# Patient Record
Sex: Male | Born: 1975 | Race: Black or African American | Hispanic: No | Marital: Single | Smoking: Current every day smoker
Health system: Southern US, Community
[De-identification: ages and names within clinical notes are randomized; demographics above are authoritative.]

## PROBLEM LIST (undated history)

## (undated) DIAGNOSIS — E119 Type 2 diabetes mellitus without complications: Secondary | ICD-10-CM

## (undated) DIAGNOSIS — R739 Hyperglycemia, unspecified: Secondary | ICD-10-CM

## (undated) DIAGNOSIS — E559 Vitamin D deficiency, unspecified: Secondary | ICD-10-CM

## (undated) HISTORY — DX: Type 2 diabetes mellitus without complications: E11.9

## (undated) HISTORY — PX: HAND SURGERY: SHX662

## (undated) HISTORY — PX: EXTERNAL EAR SURGERY: SHX627

---
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
## 2019-07-29 ENCOUNTER — Emergency Department (HOSPITAL_COMMUNITY)
Admission: EM | Admit: 2019-07-29 | Discharge: 2019-07-30 | Disposition: A | Discharge status: Home / Self Care | Payer: Self-pay | Attending: Emergency Medicine | Admitting: Emergency Medicine

## 2019-07-29 ENCOUNTER — Emergency Department (HOSPITAL_COMMUNITY): Payer: Self-pay

## 2019-07-29 ENCOUNTER — Other Ambulatory Visit: Payer: Self-pay

## 2019-07-29 ENCOUNTER — Encounter (HOSPITAL_COMMUNITY): Payer: Self-pay

## 2019-07-29 DIAGNOSIS — F17210 Nicotine dependence, cigarettes, uncomplicated: Secondary | ICD-10-CM | POA: Insufficient documentation

## 2019-07-29 DIAGNOSIS — H538 Other visual disturbances: Secondary | ICD-10-CM | POA: Insufficient documentation

## 2019-07-29 DIAGNOSIS — R739 Hyperglycemia, unspecified: Secondary | ICD-10-CM | POA: Insufficient documentation

## 2019-07-29 DIAGNOSIS — F1210 Cannabis abuse, uncomplicated: Secondary | ICD-10-CM | POA: Insufficient documentation

## 2019-07-29 LAB — COMPREHENSIVE METABOLIC PANEL
ALT: 41 U/L (ref 0–44)
AST: 30 U/L (ref 15–41)
Albumin: 4.2 g/dL (ref 3.5–5.0)
Alkaline Phosphatase: 68 U/L (ref 38–126)
Anion gap: 15 (ref 5–15)
BUN: 21 mg/dL — ABNORMAL HIGH (ref 6–20)
CO2: 19 mmol/L — ABNORMAL LOW (ref 22–32)
Calcium: 9.4 mg/dL (ref 8.9–10.3)
Chloride: 94 mmol/L — ABNORMAL LOW (ref 98–111)
Creatinine, Ser: 1.32 mg/dL — ABNORMAL HIGH (ref 0.61–1.24)
GFR calc Af Amer: >60 mL/min (ref >60)
GFR calc non Af Amer: >60 mL/min (ref >60)
Glucose, Bld: 751 mg/dL (ref 70–99)
Potassium: 5 mmol/L (ref 3.5–5.1)
Sodium: 128 mmol/L — ABNORMAL LOW (ref 135–145)
Total Bilirubin: 1.4 mg/dL — ABNORMAL HIGH (ref 0.3–1.2)
Total Protein: 7.5 g/dL (ref 6.5–8.1)

## 2019-07-29 LAB — CBC WITH DIFFERENTIAL/PLATELET
Abs Immature Granulocytes: 0.07 10*3/uL (ref 0.00–0.07)
Basophils Absolute: 0.1 10*3/uL (ref 0.0–0.1)
Basophils Relative: 0 %
Eosinophils Absolute: 0.1 10*3/uL (ref 0.0–0.5)
Eosinophils Relative: 1 %
HCT: 49.1 % (ref 39.0–52.0)
Hemoglobin: 16.6 g/dL (ref 13.0–17.0)
Immature Granulocytes: 1 %
Lymphocytes Relative: 17 %
Lymphs Abs: 2.5 10*3/uL (ref 0.7–4.0)
MCH: 29.9 pg (ref 26.0–34.0)
MCHC: 33.8 g/dL (ref 30.0–36.0)
MCV: 88.3 fL (ref 80.0–100.0)
Monocytes Absolute: 1.2 10*3/uL — ABNORMAL HIGH (ref 0.1–1.0)
Monocytes Relative: 8 %
Neutro Abs: 10.9 10*3/uL — ABNORMAL HIGH (ref 1.7–7.7)
Neutrophils Relative %: 73 %
Platelets: 242 10*3/uL (ref 150–400)
RBC: 5.56 MIL/uL (ref 4.22–5.81)
RDW: 13 % (ref 11.5–15.5)
WBC: 14.9 10*3/uL — ABNORMAL HIGH (ref 4.0–10.5)
nRBC: 0 % (ref 0.0–0.2)

## 2019-07-29 LAB — CBG MONITORING, ED
Glucose-Capillary: 481 mg/dL — ABNORMAL HIGH (ref 70–99)
Glucose-Capillary: 462 mg/dL — ABNORMAL HIGH (ref 70–99)
Glucose-Capillary: 355 mg/dL — ABNORMAL HIGH (ref 70–99)

## 2019-07-29 LAB — TROPONIN I (HIGH SENSITIVITY)
Troponin I (High Sensitivity): 10 ng/L
Troponin I (High Sensitivity): 9 ng/L

## 2019-07-29 LAB — D-DIMER, QUANTITATIVE: D-Dimer, Quant: 0.3 ug/mL-FEU (ref 0.00–0.50)

## 2019-07-29 LAB — LIPASE, BLOOD: Lipase: 40 U/L (ref 11–51)

## 2019-07-29 MED ORDER — METFORMIN HCL 1000 MG PO TABS: 1000.0000 mg | ORAL_TABLET | Freq: Two times a day (BID) | ORAL | 0 refills | Status: DC

## 2019-07-29 MED ORDER — INSULIN ASPART 100 UNIT/ML ~~LOC~~ SOLN
8.0000 [IU] | Freq: Once | SUBCUTANEOUS | Status: AC
  Administered 2019-07-29: 8 [IU] via INTRAVENOUS

## 2019-07-29 MED ORDER — SODIUM CHLORIDE 0.9 % IV BOLUS
1000.0000 mL | Freq: Once | INTRAVENOUS | Status: AC
  Administered 2019-07-29: 23:00:00 1000 mL via INTRAVENOUS

## 2019-07-29 MED ORDER — METFORMIN HCL 500 MG PO TABS
500.0000 mg | ORAL_TABLET | Freq: Once | ORAL | Status: AC
  Administered 2019-07-29: 500 mg via ORAL
  Filled 2019-07-29: qty 1

## 2019-07-29 MED ORDER — INSULIN ASPART 100 UNIT/ML ~~LOC~~ SOLN
5.0000 [IU] | Freq: Once | SUBCUTANEOUS | Status: AC
  Administered 2019-07-29: 22:00:00 5 [IU] via INTRAVENOUS

## 2019-07-29 MED ORDER — SODIUM CHLORIDE 0.9 % IV BOLUS
1000.0000 mL | Freq: Once | INTRAVENOUS | Status: AC
  Administered 2019-07-29: 19:00:00 1000 mL via INTRAVENOUS

## 2019-07-29 NOTE — ED Provider Notes (Signed)
Emergency Department Provider Note   I have reviewed the triage vital signs and the nursing notes.   HISTORY  Chief Complaint Weakness and Blurred Vision   HPI Scott Allison is a 43 y.o. male presents to the emergency department for evaluation of generalized weakness, feeling "warm all over," and worsening blurry vision.  Patient states that symptoms began yesterday.  He felt like he might pass out at times but has not had full syncope.  He denies chest pain, shortness of breath, heart palpitations.  No fevers, chills.  No rhinorrhea.  Denies anosmia.  No sick contacts.  Denies unilateral weakness or numbness. No HA.    History reviewed. No pertinent past medical history.  There are no active problems to display for this patient.   Past Surgical History:  Procedure Laterality Date  . EXTERNAL EAR SURGERY    . HAND SURGERY      Allergies Patient has no known allergies.  No family history on file.  Social History Social History   Tobacco Use  . Smoking status: Current Every Day Smoker    Packs/day: 1.00    Years: 30.00    Pack years: 30.00    Types: Cigarettes  Substance Use Topics  . Alcohol use: Not Currently  . Drug use: Yes    Types: Marijuana    Review of Systems  Constitutional: No fever/chills. Positive generalized weakness.  Eyes: Worsening blurry vision.  ENT: No sore throat. Cardiovascular: Denies chest pain. Positive near syncope.  Respiratory: Denies shortness of breath. Gastrointestinal: No abdominal pain.  No nausea, no vomiting.  No diarrhea.  No constipation. Genitourinary: Negative for dysuria. Musculoskeletal: Negative for back pain. Skin: Negative for rash. Neurological: Negative for headaches, focal weakness or numbness.  10-point ROS otherwise negative.  ____________________________________________   PHYSICAL EXAM:  VITAL SIGNS: ED Triage Vitals  Enc Vitals Group     BP 07/29/19 1734 (!) 161/101     Pulse Rate 07/29/19 1734  97     Resp 07/29/19 1734 (!) 24     Temp 07/29/19 1734 99 F (37.2 C)     Temp Source 07/29/19 1734 Oral     SpO2 07/29/19 1734 96 %     Weight 07/29/19 1741 250 lb (113.4 kg)     Height 07/29/19 1741 6\' 2"  (1.88 m)   Constitutional: Alert and oriented. Well appearing and in no acute distress. Eyes: Conjunctivae are normal. PERRL. EOMI. Head: Atraumatic. Nose: No congestion/rhinnorhea. Mouth/Throat: Mucous membranes are moist.  Neck: No stridor.   Cardiovascular: Normal rate, regular rhythm. Good peripheral circulation. Grossly normal heart sounds.   Respiratory: Normal respiratory effort.  No retractions. Lungs CTAB. Gastrointestinal: Soft and nontender. No distention.  Musculoskeletal: No lower extremity tenderness nor edema. No gross deformities of extremities. Neurologic:  Normal speech and language. No gross focal neurologic deficits are appreciated.  Skin:  Skin is warm, dry and intact. No rash noted.  ____________________________________________   LABS (all labs ordered are listed, but only abnormal results are displayed)  Labs Reviewed  COMPREHENSIVE METABOLIC PANEL - Abnormal; Notable for the following components:      Result Value   Sodium 128 (*)    Chloride 94 (*)    CO2 19 (*)    Glucose, Bld 751 (*)    BUN 21 (*)    Creatinine, Ser 1.32 (*)    Total Bilirubin 1.4 (*)    All other components within normal limits  CBC WITH DIFFERENTIAL/PLATELET - Abnormal; Notable for the  following components:   WBC 14.9 (*)    Neutro Abs 10.9 (*)    Monocytes Absolute 1.2 (*)    All other components within normal limits  BASIC METABOLIC PANEL - Abnormal; Notable for the following components:   Glucose, Bld 362 (*)    All other components within normal limits  CBG MONITORING, ED - Abnormal; Notable for the following components:   Glucose-Capillary 481 (*)    All other components within normal limits  CBG MONITORING, ED - Abnormal; Notable for the following components:    Glucose-Capillary 462 (*)    All other components within normal limits  CBG MONITORING, ED - Abnormal; Notable for the following components:   Glucose-Capillary 355 (*)    All other components within normal limits  CBG MONITORING, ED - Abnormal; Notable for the following components:   Glucose-Capillary 336 (*)    All other components within normal limits  LIPASE, BLOOD  D-DIMER, QUANTITATIVE (NOT AT [facility])  TROPONIN I (HIGH SENSITIVITY)  TROPONIN I (HIGH SENSITIVITY)   ____________________________________________  EKG   EKG Interpretation  Date/Time:  Sunday July 29 2019 17:33:31 EDT Ventricular Rate:  96 PR Interval:    QRS Duration: 78 QT Interval:  349 QTC Calculation: 441 R Axis:   50 Text Interpretation:  Sinus rhythm Left atrial enlargement Borderline T wave abnormalities No STEMI  Confirmed by Alona Bene [phone]) on 07/29/2019 5:54:28 PM       ____________________________________________  RADIOLOGY  Dg Chest Portable 1 View  Result Date: 07/29/2019 CLINICAL DATA:  Near syncope. EXAM: PORTABLE CHEST 1 VIEW COMPARISON:  None. FINDINGS: Right distal clavicular deformity likely from an old fracture. The lungs appear clear.  Cardiac and mediastinal contours normal. No pleural effusion identified. IMPRESSION: No active cardiopulmonary disease is radiographically apparent. Electronically Signed   By: Gaylyn Rong M.D.   On: 07/29/2019 18:25    ____________________________________________   PROCEDURES  Procedure(s) performed:   Procedures  None ____________________________________________   INITIAL IMPRESSION / ASSESSMENT AND PLAN / ED COURSE  Pertinent labs & imaging results that were available during my care of the patient were reviewed by me and considered in my medical decision making (see chart for details).   Patient presents to the emergency department for evaluation of generalized weakness, near syncope, flushed feeling, blurry vision.  No fever  but patient does have sinus tachycardia on the monitor during my evaluation.  Oxygen saturation is normal.  Lower suspicion for infectious etiology.  Patient has elevated blood pressure as well.  He does not follow with a PCP.  Plan for screening lab work to assess for possible new diabetes.  No evidence to suspect hypertensive emergency.  Doubt stroke.  Will obtain d-dimer but lower suspicion for PE.  Patient with significant hyperglycemia. No anion gap/DKA. IVF and insulin given with down-trending CBG. Starting metformin. Patient to f/u with PCP ASAP. Will discharge.  ____________________________________________  FINAL CLINICAL IMPRESSION(S) / ED DIAGNOSES  Final diagnoses:  Hyperglycemia     MEDICATIONS GIVEN DURING THIS VISIT:  Medications  sodium chloride 0.9 % bolus 1,000 mL (0 mLs Intravenous Stopped 07/29/19 2211)  insulin aspart (novoLOG) injection 8 Units (8 Units Intravenous Given 07/29/19 1947)  insulin aspart (novoLOG) injection 5 Units (5 Units Intravenous Given 07/29/19 2148)  sodium chloride 0.9 % bolus 1,000 mL (0 mLs Intravenous Stopped 07/30/19 0010)  metFORMIN (GLUCOPHAGE) tablet 500 mg (500 mg Oral Given 07/29/19 2310)     NEW OUTPATIENT MEDICATIONS STARTED DURING THIS VISIT:  Discharge Medication  List as of 07/29/2019 11:14 PM    START taking these medications   Details  metFORMIN (GLUCOPHAGE) 1000 MG tablet Take 1 tablet (1,000 mg total) by mouth 2 (two) times daily., Starting Sun 07/29/2019, Until Tue 08/28/2019, Print        Note:  This document was prepared using Dragon voice recognition software and may include unintentional dictation errors.  Alona BeneJoshua , MD Emergency Medicine    , Arlyss RepressJoshua G, MD 07/30/19 1031

## 2019-07-29 NOTE — Discharge Instructions (Signed)
You were seen in the emergency department today with diabetes.  Your blood sugar is high and causing many of the symptoms.  Please take the medication as prescribed and follow the diet recommendations in the paperwork.  You need to urgent follow-up with the primary care physician.  Please call the number provided to schedule an urgent follow-up appointment.  If your symptoms worsen, you develop abdominal pain, shortness of breath, vomiting, or other severe symptoms he should return to the emergency department immediately for evaluation rather than waiting for your PCP appointment.

## 2019-07-29 NOTE — ED Triage Notes (Signed)
Pt presents with c/o generalized weakness when standing, "feeling warm all over", and blurry vision since yesterday. Pt states "I can't get comfortable". Pt endorses an intermittent cough for a few days. Pt denies any known sick contacts.

## 2019-07-30 LAB — BASIC METABOLIC PANEL
Anion gap: 10 (ref 5–15)
BUN: 16 mg/dL (ref 6–20)
CO2: 24 mmol/L (ref 22–32)
Calcium: 8.9 mg/dL (ref 8.9–10.3)
Chloride: 103 mmol/L (ref 98–111)
Creatinine, Ser: 1.13 mg/dL (ref 0.61–1.24)
GFR calc Af Amer: >60 mL/min (ref >60)
GFR calc non Af Amer: >60 mL/min (ref >60)
Glucose, Bld: 362 mg/dL — ABNORMAL HIGH (ref 70–99)
Potassium: 3.8 mmol/L (ref 3.5–5.1)
Sodium: 137 mmol/L (ref 135–145)

## 2019-07-30 LAB — CBG MONITORING, ED: Glucose-Capillary: 336 mg/dL — ABNORMAL HIGH (ref 70–99)

## 2019-07-30 NOTE — ED Notes (Signed)
Due to continued hyperglycemia after interventions, BMP collected and resent

## 2019-07-30 NOTE — ED Provider Notes (Signed)
Repeat BMP with hyperglycemia. No anion gap. Stable for discharge with PO medications.   Ezequiel Essex, MD 07/30/19 [phone]

## 2019-07-30 NOTE — ED Notes (Signed)
Patient verbalizes understanding of discharge instructions. Opportunity for questioning and answers were provided. Armband removed by staff, pt discharged from ED ambulatory.   

## 2019-07-31 DIAGNOSIS — E559 Vitamin D deficiency, unspecified: Secondary | ICD-10-CM

## 2019-07-31 DIAGNOSIS — R739 Hyperglycemia, unspecified: Secondary | ICD-10-CM

## 2019-07-31 HISTORY — DX: Vitamin D deficiency, unspecified: E55.9

## 2019-07-31 HISTORY — DX: Hyperglycemia, unspecified: R73.9

## 2019-08-10 ENCOUNTER — Ambulatory Visit: Payer: Self-pay | Admitting: Family Medicine

## 2019-08-20 ENCOUNTER — Encounter: Payer: Self-pay | Admitting: Family Medicine

## 2019-08-20 ENCOUNTER — Ambulatory Visit (INDEPENDENT_AMBULATORY_CARE_PROVIDER_SITE_OTHER): Payer: Self-pay | Admitting: Family Medicine

## 2019-08-20 ENCOUNTER — Other Ambulatory Visit: Payer: Self-pay

## 2019-08-20 VITALS — BP 164/84 | HR 71 | Temp 98.4°F | Ht 74.0 in | Wt 273.0 lb

## 2019-08-20 DIAGNOSIS — Z0000 Encounter for general adult medical examination without abnormal findings: Secondary | ICD-10-CM

## 2019-08-20 DIAGNOSIS — H538 Other visual disturbances: Secondary | ICD-10-CM

## 2019-08-20 DIAGNOSIS — R7309 Other abnormal glucose: Secondary | ICD-10-CM

## 2019-08-20 DIAGNOSIS — Z7689 Persons encountering health services in other specified circumstances: Secondary | ICD-10-CM

## 2019-08-20 DIAGNOSIS — Z09 Encounter for follow-up examination after completed treatment for conditions other than malignant neoplasm: Secondary | ICD-10-CM

## 2019-08-20 DIAGNOSIS — E1169 Type 2 diabetes mellitus with other specified complication: Secondary | ICD-10-CM

## 2019-08-20 DIAGNOSIS — R8290 Unspecified abnormal findings in urine: Secondary | ICD-10-CM

## 2019-08-20 DIAGNOSIS — R739 Hyperglycemia, unspecified: Secondary | ICD-10-CM

## 2019-08-20 DIAGNOSIS — Z794 Long term (current) use of insulin: Secondary | ICD-10-CM

## 2019-08-20 LAB — POCT GLUCOSE (DEVICE FOR HOME USE)
Glucose Fasting, POC: 124 mg/dL — AB (ref 70–99)
POC Glucose: 124 mg/dl — AB (ref 70–99)

## 2019-08-20 LAB — POCT GLYCOSYLATED HEMOGLOBIN (HGB A1C)
HbA1c POC (<> result, manual entry): 10.1 % (ref 4.0–5.6)
HbA1c, POC (controlled diabetic range): 10.1 % — AB (ref 0.0–7.0)
HbA1c, POC (prediabetic range): 10.1 % — AB (ref 5.7–6.4)
Hemoglobin A1C: 10.1 % — AB (ref 4.0–5.6)

## 2019-08-20 MED ORDER — BLOOD GLUCOSE METER KIT: PACK | 0 refills | Status: AC

## 2019-08-20 MED ORDER — METFORMIN HCL 1000 MG PO TABS: 1000.0000 mg | ORAL_TABLET | Freq: Two times a day (BID) | ORAL | 3 refills | Status: DC

## 2019-08-20 MED ORDER — GLIPIZIDE 10 MG PO TABS: 10.0000 mg | ORAL_TABLET | Freq: Two times a day (BID) | ORAL | 3 refills | Status: DC

## 2019-08-20 NOTE — Patient Instructions (Signed)
Heart-Healthy Eating Plan Many factors influence your heart (coronary) health, including eating and exercise habits. Coronary risk increases with abnormal blood fat (lipid) levels. Heart-healthy meal planning includes limiting unhealthy fats, increasing healthy fats, and making other diet and lifestyle changes. What is my plan? Your health care provider may recommend that you:  Limit your fat intake to _________% or less of your total calories each day.  Limit your saturated fat intake to _________% or less of your total calories each day.  Limit the amount of cholesterol in your diet to less than _________ mg per day. What are tips for following this plan? Cooking Cook foods using methods other than frying. Baking, boiling, grilling, and broiling are all good options. Other ways to reduce fat include:  Removing the skin from poultry.  Removing all visible fats from meats.  Steaming vegetables in water or broth. Meal planning   At meals, imagine dividing your plate into fourths: ? Fill one-half of your plate with vegetables and green salads. ? Fill one-fourth of your plate with whole grains. ? Fill one-fourth of your plate with lean protein foods.  Eat 4-5 servings of vegetables per day. One serving equals 1 cup raw or cooked vegetable, or 2 cups raw leafy greens.  Eat 4-5 servings of fruit per day. One serving equals 1 medium whole fruit,  cup dried fruit,  cup fresh, frozen, or canned fruit, or  cup 100% fruit juice.  Eat more foods that contain soluble fiber. Examples include apples, broccoli, carrots, beans, peas, and barley. Aim to get 25-30 g of fiber per day.  Increase your consumption of legumes, nuts, and seeds to 4-5 servings per week. One serving of dried beans or legumes equals  cup cooked, 1 serving of nuts is  cup, and 1 serving of seeds equals 1 tablespoon. Fats  Choose healthy fats more often. Choose monounsaturated and polyunsaturated fats, such as olive and  canola oils, flaxseeds, walnuts, almonds, and seeds.  Eat more omega-3 fats. Choose salmon, mackerel, sardines, tuna, flaxseed oil, and ground flaxseeds. Aim to eat fish at least 2 times each week.  Check food labels carefully to identify foods with trans fats or high amounts of saturated fat.  Limit saturated fats. These are found in animal products, such as meats, butter, and cream. Plant sources of saturated fats include palm oil, palm kernel oil, and coconut oil.  Avoid foods with partially hydrogenated oils in them. These contain trans fats. Examples are stick margarine, some tub margarines, cookies, crackers, and other baked goods.  Avoid fried foods. General information  Eat more home-cooked food and less restaurant, buffet, and fast food.  Limit or avoid alcohol.  Limit foods that are high in starch and sugar.  Lose weight if you are overweight. Losing just 5-10% of your body weight can help your overall health and prevent diseases such as diabetes and heart disease.  Monitor your salt (sodium) intake, especially if you have high blood pressure. Talk with your health care provider about your sodium intake.  Try to incorporate more vegetarian meals weekly. What foods can I eat? Fruits All fresh, canned (in natural juice), or frozen fruits. Vegetables Fresh or frozen vegetables (raw, steamed, roasted, or grilled). Green salads. Grains Most grains. Choose whole wheat and whole grains most of the time. Rice and pasta, including brown rice and pastas made with whole wheat. Meats and other proteins Lean, well-trimmed beef, veal, pork, and lamb. Chicken and [place] without skin. All fish and shellfish. Wild  duck, rabbit, pheasant, and venison. Egg whites or low-cholesterol egg substitutes. Dried beans, peas, lentils, and tofu. Seeds and most nuts. Dairy Low-fat or nonfat cheeses, including ricotta and mozzarella. Skim or 1% milk (liquid, powdered, or evaporated). Buttermilk made  with low-fat milk. Nonfat or low-fat yogurt. Fats and oils Non-hydrogenated (trans-free) margarines. Vegetable oils, including soybean, sesame, sunflower, olive, peanut, safflower, corn, canola, and cottonseed. Salad dressings or mayonnaise made with a vegetable oil. Beverages Water (mineral or sparkling). Coffee and tea. Diet carbonated beverages. Sweets and desserts Sherbet, gelatin, and fruit ice. Small amounts of dark chocolate. Limit all sweets and desserts. Seasonings and condiments All seasonings and condiments. The items listed above may not be a complete list of foods and beverages you can eat. Contact a dietitian for more options. What foods are not recommended? Fruits Canned fruit in heavy syrup. Fruit in cream or butter sauce. Fried fruit. Limit coconut. Vegetables Vegetables cooked in cheese, cream, or butter sauce. Fried vegetables. Grains Breads made with saturated or trans fats, oils, or whole milk. Croissants. Sweet rolls. Donuts. High-fat crackers, such as cheese crackers. Meats and other proteins Fatty meats, such as hot dogs, ribs, sausage, bacon, rib-eye roast or steak. High-fat deli meats, such as salami and bologna. Caviar. Domestic duck and goose. Organ meats, such as liver. Dairy Cream, sour cream, cream cheese, and creamed cottage cheese. Whole milk cheeses. Whole or 2% milk (liquid, evaporated, or condensed). Whole buttermilk. Cream sauce or high-fat cheese sauce. Whole-milk yogurt. Fats and oils Meat fat, or shortening. Cocoa butter, hydrogenated oils, palm oil, coconut oil, palm kernel oil. Solid fats and shortenings, including bacon fat, salt pork, lard, and butter. Nondairy cream substitutes. Salad dressings with cheese or sour cream. Beverages Regular sodas and any drinks with added sugar. Sweets and desserts Frosting. Pudding. Cookies. Cakes. Pies. Milk chocolate or white chocolate. Buttered syrups. Full-fat ice cream or ice cream drinks. The items listed  above may not be a complete list of foods and beverages to avoid. Contact a dietitian for more information. Summary  Heart-healthy meal planning includes limiting unhealthy fats, increasing healthy fats, and making other diet and lifestyle changes.  Lose weight if you are overweight. Losing just 5-10% of your body weight can help your overall health and prevent diseases such as diabetes and heart disease.  Focus on eating a balance of foods, including fruits and vegetables, low-fat or nonfat dairy, lean protein, nuts and legumes, whole grains, and heart-healthy oils and fats. This information is not intended to replace advice given to you by your health care provider. Make sure you discuss any questions you have with your health care provider. Document Released: 08/24/2008 Document Revised: 12/23/2017 Document Reviewed: 12/23/2017 Elsevier Patient Education  2020 [place] DASH stands for "Dietary Approaches to Stop Hypertension." The DASH eating plan is a healthy eating plan that has been shown to reduce high blood pressure (hypertension). It may also reduce your risk for type 2 diabetes, heart disease, and stroke. The DASH eating plan may also help with weight loss. What are tips for following this plan?  General guidelines  Avoid eating more than 2,300 mg (milligrams) of salt (sodium) a day. If you have hypertension, you may need to reduce your sodium intake to 1,500 mg a day.  Limit alcohol intake to no more than 1 drink a day for nonpregnant women and 2 drinks a day for men. One drink equals 12 oz of beer, 5 oz of wine, or 1 oz of  hard liquor.  Work with your health care provider to maintain a healthy body weight or to lose weight. Ask what an ideal weight is for you.  Get at least 30 minutes of exercise that causes your heart to beat faster (aerobic exercise) most days of the week. Activities may include walking, swimming, or biking.  Work with your health care  provider or diet and nutrition specialist (dietitian) to adjust your eating plan to your individual calorie needs. Reading food labels   Check food labels for the amount of sodium per serving. Choose foods with less than 5 percent of the Daily Value of sodium. Generally, foods with less than 300 mg of sodium per serving fit into this eating plan.  To find whole grains, look for the word "whole" as the first word in the ingredient list. Shopping  Buy products labeled as "low-sodium" or "no salt added."  Buy fresh foods. Avoid canned foods and premade or frozen meals. Cooking  Avoid adding salt when cooking. Use salt-free seasonings or herbs instead of table salt or sea salt. Check with your health care provider or pharmacist before using salt substitutes.  Do not fry foods. Cook foods using healthy methods such as baking, boiling, grilling, and broiling instead.  Cook with heart-healthy oils, such as olive, canola, soybean, or sunflower oil. Meal planning  Eat a balanced diet that includes: ? 5 or more servings of fruits and vegetables each day. At each meal, try to fill half of your plate with fruits and vegetables. ? Up to 6-8 servings of whole grains each day. ? Less than 6 oz of lean meat, poultry, or fish each day. A 3-oz serving of meat is about the same size as a deck of cards. One egg equals 1 oz. ? 2 servings of low-fat dairy each day. ? A serving of nuts, seeds, or beans 5 times each week. ? Heart-healthy fats. Healthy fats called Omega-3 fatty acids are found in foods such as flaxseeds and coldwater fish, like sardines, salmon, and mackerel.  Limit how much you eat of the following: ? Canned or prepackaged foods. ? Food that is high in trans fat, such as fried foods. ? Food that is high in saturated fat, such as fatty meat. ? Sweets, desserts, sugary drinks, and other foods with added sugar. ? Full-fat dairy products.  Do not salt foods before eating.  Try to eat at  least 2 vegetarian meals each week.  Eat more home-cooked food and less restaurant, buffet, and fast food.  When eating at a restaurant, ask that your food be prepared with less salt or no salt, if possible. What foods are recommended? The items listed may not be a complete list. Talk with your dietitian about what dietary choices are best for you. Grains Whole-grain or whole-wheat bread. Whole-grain or whole-wheat pasta. Brown rice. [place]. Bulgur. Whole-grain and low-sodium cereals. Pita bread. Low-fat, low-sodium crackers. Whole-wheat flour tortillas. Vegetables Fresh or frozen vegetables (raw, steamed, roasted, or grilled). Low-sodium or reduced-sodium tomato and vegetable juice. Low-sodium or reduced-sodium tomato sauce and tomato paste. Low-sodium or reduced-sodium canned vegetables. Fruits All fresh, dried, or frozen fruit. Canned fruit in natural juice (without added sugar). Meat and other protein foods Skinless chicken or [place]. Ground chicken or [place]. Pork with fat trimmed off. Fish and seafood. Egg whites. Dried beans, peas, or lentils. Unsalted nuts, nut butters, and seeds. Unsalted canned beans. Lean cuts of beef with fat trimmed off. Low-sodium, lean deli meat. Dairy Low-fat (1%)  or fat-free (skim) milk. Fat-free, low-fat, or reduced-fat cheeses. Nonfat, low-sodium ricotta or cottage cheese. Low-fat or nonfat yogurt. Low-fat, low-sodium cheese. Fats and oils Soft margarine without trans fats. Vegetable oil. Low-fat, reduced-fat, or light mayonnaise and salad dressings (reduced-sodium). Canola, safflower, olive, soybean, and sunflower oils. Avocado. Seasoning and other foods Herbs. Spices. Seasoning mixes without salt. Unsalted popcorn and pretzels. Fat-free sweets. What foods are not recommended? The items listed may not be a complete list. Talk with your dietitian about what dietary choices are best for you. Grains Baked goods made with fat, such as croissants,  muffins, or some breads. Dry pasta or rice meal packs. Vegetables Creamed or fried vegetables. Vegetables in a cheese sauce. Regular canned vegetables (not low-sodium or reduced-sodium). Regular canned tomato sauce and paste (not low-sodium or reduced-sodium). Regular tomato and vegetable juice (not low-sodium or reduced-sodium). [place]. Olives. Fruits Canned fruit in a light or heavy syrup. Fried fruit. Fruit in cream or butter sauce. Meat and other protein foods Fatty cuts of meat. Ribs. Fried meat. [place]. Sausage. Bologna and other processed lunch meats. Salami. Fatback. Hotdogs. Bratwurst. Salted nuts and seeds. Canned beans with added salt. Canned or smoked fish. Whole eggs or egg yolks. Chicken or [place] with skin. Dairy Whole or 2% milk, cream, and half-and-half. Whole or full-fat cream cheese. Whole-fat or sweetened yogurt. Full-fat cheese. Nondairy creamers. Whipped toppings. Processed cheese and cheese spreads. Fats and oils Butter. Stick margarine. Lard. Shortening. Ghee. Bacon fat. Tropical oils, such as coconut, palm kernel, or palm oil. Seasoning and other foods Salted popcorn and pretzels. Onion salt, garlic salt, seasoned salt, table salt, and sea salt. Worcestershire sauce. Tartar sauce. Barbecue sauce. Teriyaki sauce. Soy sauce, including reduced-sodium. Steak sauce. Canned and packaged gravies. Fish sauce. Oyster sauce. Cocktail sauce. Horseradish that you find on the shelf. Ketchup. Mustard. Meat flavorings and tenderizers. Bouillon cubes. Hot sauce and Tabasco sauce. Premade or packaged marinades. Premade or packaged taco seasonings. Relishes. Regular salad dressings. Where to find more information:  National Heart, Lung, and [place]: https://wilson-eaton.com/  American Heart Association: www.heart.org Summary  The DASH eating plan is a healthy eating plan that has been shown to reduce high blood pressure (hypertension). It may also reduce your risk for type 2 diabetes,  heart disease, and stroke.  With the DASH eating plan, you should limit salt (sodium) intake to 2,300 mg a day. If you have hypertension, you may need to reduce your sodium intake to 1,500 mg a day.  When on the DASH eating plan, aim to eat more fresh fruits and vegetables, whole grains, lean proteins, low-fat dairy, and heart-healthy fats.  Work with your health care provider or diet and nutrition specialist (dietitian) to adjust your eating plan to your individual calorie needs. This information is not intended to replace advice given to you by your health care provider. Make sure you discuss any questions you have with your health care provider. Document Released: 11/04/2011 Document Revised: 10/28/2017 Document Reviewed: 11/08/2016 Elsevier Patient Education  2020 [employer]. Diabetes Mellitus and Exercise Exercising regularly is important for your overall health, especially when you have diabetes (diabetes mellitus). Exercising is not only about losing weight. It has many other health benefits, such as increasing muscle strength and bone density and reducing body fat and stress. This leads to improved fitness, flexibility, and endurance, all of which result in better overall health. Exercise has additional benefits for people with diabetes, including:  Reducing appetite.  Helping to lower and control blood glucose.  Lowering blood pressure.  Helping to control amounts of fatty substances (lipids) in the blood, such as cholesterol and triglycerides.  Helping the body to respond better to insulin (improving insulin sensitivity).  Reducing how much insulin the body needs.  Decreasing the risk for heart disease by: ? Lowering cholesterol and triglyceride levels. ? Increasing the levels of good cholesterol. ? Lowering blood glucose levels. What is my activity plan? Your health care provider or certified diabetes educator can help you make a plan for the type and frequency of  exercise (activity plan) that works for you. Make sure that you:  Do at least 150 minutes of moderate-intensity or vigorous-intensity exercise each week. This could be brisk walking, biking, or water aerobics. ? Do stretching and strength exercises, such as yoga or weightlifting, at least 2 times a week. ? Spread out your activity over at least 3 days of the week.  Get some form of physical activity every day. ? Do not go more than 2 days in a row without some kind of physical activity. ? Avoid being inactive for more than 30 minutes at a time. Take frequent breaks to walk or stretch.  Choose a type of exercise or activity that you enjoy, and set realistic goals.  Start slowly, and gradually increase the intensity of your exercise over time. What do I need to know about managing my diabetes?   Check your blood glucose before and after exercising. ? If your blood glucose is 240 mg/dL (13.3 mmol/L) or higher before you exercise, check your urine for ketones. If you have ketones in your urine, do not exercise until your blood glucose returns to normal. ? If your blood glucose is 100 mg/dL (5.6 mmol/L) or lower, eat a snack containing 15-20 grams of carbohydrate. Check your blood glucose 15 minutes after the snack to make sure that your level is above 100 mg/dL (5.6 mmol/L) before you start your exercise.  Know the symptoms of low blood glucose (hypoglycemia) and how to treat it. Your risk for hypoglycemia increases during and after exercise. Common symptoms of hypoglycemia can include: ? Hunger. ? Anxiety. ? Sweating and feeling clammy. ? Confusion. ? Dizziness or feeling light-headed. ? Increased heart rate or palpitations. ? Blurry vision. ? Tingling or numbness around the mouth, lips, or tongue. ? Tremors or shakes. ? Irritability.  Keep a rapid-acting carbohydrate snack available before, during, and after exercise to help prevent or treat hypoglycemia.  Avoid injecting insulin into  areas of the body that are going to be exercised. For example, avoid injecting insulin into: ? The arms, when playing tennis. ? The legs, when jogging.  Keep records of your exercise habits. Doing this can help you and your health care provider adjust your diabetes management plan as needed. Write down: ? Food that you eat before and after you exercise. ? Blood glucose levels before and after you exercise. ? The type and amount of exercise you have done. ? When your insulin is expected to peak, if you use insulin. Avoid exercising at times when your insulin is peaking.  When you start a new exercise or activity, work with your health care provider to make sure the activity is safe for you, and to adjust your insulin, medicines, or food intake as needed.  Drink plenty of water while you exercise to prevent dehydration or heat stroke. Drink enough fluid to keep your urine clear or pale yellow. Summary  Exercising regularly is important for your overall health, especially when  you have diabetes (diabetes mellitus).  Exercising has many health benefits, such as increasing muscle strength and bone density and reducing body fat and stress.  Your health care provider or certified diabetes educator can help you make a plan for the type and frequency of exercise (activity plan) that works for you.  When you start a new exercise or activity, work with your health care provider to make sure the activity is safe for you, and to adjust your insulin, medicines, or food intake as needed. This information is not intended to replace advice given to you by your health care provider. Make sure you discuss any questions you have with your health care provider. Document Released: 02/05/2004 Document Revised: 06/09/2017 Document Reviewed: 04/26/2016 Elsevier Patient Education  2020 [place]. Hyperglycemia Hyperglycemia is when the sugar (glucose) level in your blood is too high. It may not cause symptoms.  If you do have symptoms, they may include warning signs, such as:  Feeling more thirsty than normal.  Hunger.  Feeling tired.  Needing to pee (urinate) more than normal.  Blurry eyesight (vision). You may get other symptoms as it gets worse, such as:  Dry mouth.  Not being hungry (loss of appetite).  Fruity-smelling breath.  Weakness.  Weight gain or loss that is not planned. Weight loss may be fast.  A tingling or numb feeling in your hands or feet.  Headache.  Skin that does not bounce back quickly when it is lightly pinched and released (poor skin turgor).  Pain in your belly (abdomen).  Cuts or bruises that heal slowly. High blood sugar can happen to people who do or do not have diabetes. High blood sugar can happen slowly or quickly, and it can be an emergency. Follow these instructions at home: General instructions  Take over-the-counter and prescription medicines only as told by your doctor.  Do not use products that contain nicotine or tobacco, such as cigarettes and e-cigarettes. If you need help quitting, ask your doctor.  Limit alcohol intake to no more than 1 drink per day for nonpregnant women and 2 drinks per day for men. One drink equals 12 oz of beer, 5 oz of wine, or 1 oz of hard liquor.  Manage stress. If you need help with this, ask your doctor.  Keep all follow-up visits as told by your doctor. This is important. Eating and drinking   Stay at a healthy weight.  Exercise regularly, as told by your doctor.  Drink enough fluid, especially when you: ? Exercise. ? Get sick. ? Are in hot temperatures.  Eat healthy foods, such as: ? Low-fat (lean) proteins. ? Complex carbs (complex carbohydrates), such as whole wheat bread or brown rice. ? Fresh fruits and vegetables. ? Low-fat dairy products. ? Healthy fats.  Drink enough fluid to keep your pee (urine) clear or pale yellow. If you have diabetes:   Make sure you know the symptoms of  hyperglycemia.  Follow your diabetes management plan, as told by your doctor. Make sure you: ? Take insulin and medicines as told. ? Follow your exercise plan. ? Follow your meal plan. Eat on time. Do not skip meals. ? Check your blood sugar as often as told. Make sure to check before and after exercise. If you exercise longer or in a different way than you normally do, check your blood sugar more often. ? Follow your sick day plan whenever you cannot eat or drink normally. Make this plan ahead of time with your doctor.  Share your diabetes management plan with people in your workplace, school, and household.  Check your urine for ketones when you are ill and as told by your doctor.  Carry a card or wear jewelry that says that you have diabetes. Contact a doctor if:  Your blood sugar level is higher than 240 mg/dL (13.3 mmol/L) for 2 days in a row.  You have problems keeping your blood sugar in your target range.  High blood sugar happens often for you. Get help right away if:  You have trouble breathing.  You have a change in how you think, feel, or act (mental status).  You feel sick to your stomach (nauseous), and that feeling does not go away.  You cannot stop throwing up (vomiting). These symptoms may be an emergency. Do not wait to see if the symptoms will go away. Get medical help right away. Call your local emergency services (911 in the [place] Do not drive yourself to the hospital. Summary  Hyperglycemia is when the sugar (glucose) level in your blood is too high.  High blood sugar can happen to people who do or do not have diabetes.  Make sure you drink enough fluids, eat healthy foods, and exercise regularly.  Contact your doctor if you have problems keeping your blood sugar in your target range. This information is not intended to replace advice given to you by your health care provider. Make sure you discuss any questions you have with your health care  provider. Document Released: 09/12/2009 Document Revised: 08/02/2016 Document Reviewed: 08/02/2016 Elsevier Patient Education  [place]. Metformin tablets What is this medicine? METFORMIN (met FOR min) is used to treat type 2 diabetes. It helps to control blood sugar. Treatment is combined with diet and exercise. This medicine can be used alone or with other medicines for diabetes. This medicine may be used for other purposes; ask your health care provider or pharmacist if you have questions. COMMON BRAND NAME(S): Glucophage What should I tell my health care provider before I take this medicine? They need to know if you have any of these conditions:  anemia  dehydration  heart disease  frequently drink alcohol-containing beverages  kidney disease  liver disease  polycystic ovary syndrome  serious infection or injury  vomiting  an unusual or allergic reaction to metformin, other medicines, foods, dyes, or preservatives  pregnant or trying to get pregnant  breast-feeding How should I use this medicine? Take this medicine by mouth with a glass of water. Follow the directions on the prescription label. Take this medicine with food. Take your medicine at regular intervals. Do not take your medicine more often than directed. Do not stop taking except on your doctor's advice. Talk to your pediatrician regarding the use of this medicine in children. While this drug may be prescribed for children as young as 14 years of age for selected conditions, precautions do apply. Overdosage: If you think you have taken too much of this medicine contact a poison control center or emergency room at once. NOTE: This medicine is only for you. Do not share this medicine with others. What if I miss a dose? If you miss a dose, take it as soon as you can. If it is almost time for your next dose, take only that dose. Do not take double or extra doses. What may interact with this medicine? Do  not take this medicine with any of the following medications:  certain contrast medicines given before X-rays, CT scans,  MRI, or other procedures  dofetilide This medicine may also interact with the following medications:  acetazolamide  alcohol  certain antivirals for HIV or hepatitis  certain medicines for blood pressure, heart disease, irregular heart beat  cimetidine  dichlorphenamide  digoxin  diuretics  male hormones, like estrogens or progestins and birth control pills  glycopyrrolate  isoniazid  lamotrigine  memantine  methazolamide  metoclopramide  midodrine  niacin  phenothiazines like chlorpromazine, mesoridazine, prochlorperazine, thioridazine  phenytoin  ranolazine  steroid medicines like prednisone or cortisone  stimulant medicines for attention disorders, weight loss, or to stay awake  thyroid medicines  topiramate  trospium  vandetanib  zonisamide This list may not describe all possible interactions. Give your health care provider a list of all the medicines, herbs, non-prescription drugs, or dietary supplements you use. Also tell them if you smoke, drink alcohol, or use illegal drugs. Some items may interact with your medicine. What should I watch for while using this medicine? Visit your doctor or health care professional for regular checks on your progress. A test called the HbA1C (A1C) will be monitored. This is a simple blood test. It measures your blood sugar control over the last 2 to 3 months. You will receive this test every 3 to 6 months. Learn how to check your blood sugar. Learn the symptoms of low and high blood sugar and how to manage them. Always carry a quick-source of sugar with you in case you have symptoms of low blood sugar. Examples include hard sugar candy or glucose tablets. Make sure others know that you can choke if you eat or drink when you develop serious symptoms of low blood sugar, such as seizures or  unconsciousness. They must get medical help at once. Tell your doctor or health care professional if you have high blood sugar. You might need to change the dose of your medicine. If you are sick or exercising more than usual, you might need to change the dose of your medicine. Do not skip meals. Ask your doctor or health care professional if you should avoid alcohol. Many nonprescription cough and cold products contain sugar or alcohol. These can affect blood sugar. This medicine may cause ovulation in premenopausal women who do not have regular monthly periods. This may increase your chances of becoming pregnant. You should not take this medicine if you become pregnant or think you may be pregnant. Talk with your doctor or health care professional about your birth control options while taking this medicine. Contact your doctor or health care professional right away if you think you are pregnant. If you are going to need surgery, a MRI, CT scan, or other procedure, tell your doctor that you are taking this medicine. You may need to stop taking this medicine before the procedure. Wear a medical ID bracelet or chain, and carry a card that describes your disease and details of your medicine and dosage times. This medicine may cause a decrease in folic acid and vitamin B12. You should make sure that you get enough vitamins while you are taking this medicine. Discuss the foods you eat and the vitamins you take with your health care professional. What side effects may I notice from receiving this medicine? Side effects that you should report to your doctor or health care professional as soon as possible:  allergic reactions like skin rash, itching or hives, swelling of the face, lips, or tongue  breathing problems  feeling faint or lightheaded, falls  muscle aches or pains  signs and symptoms of low blood sugar such as feeling anxious, confusion, dizziness, increased hunger, unusually weak or tired,  sweating, shakiness, cold, irritable, headache, blurred vision, fast heartbeat, loss of consciousness  slow or irregular heartbeat  unusual stomach pain or discomfort  unusually tired or weak Side effects that usually do not require medical attention (report to your doctor or health care professional if they continue or are bothersome):  diarrhea  headache  heartburn  metallic taste in mouth  nausea  stomach gas, upset This list may not describe all possible side effects. Call your doctor for medical advice about side effects. You may report side effects to FDA at 1-800-FDA-1088. Where should I keep my medicine? Keep out of the reach of children. Store at room temperature between 15 and 30 degrees C (59 and 86 degrees F). Protect from moisture and light. Throw away any unused medicine after the expiration date. NOTE: This sheet is a summary. It may not cover all possible information. If you have questions about this medicine, talk to your doctor, pharmacist, or health care provider.  2020 Elsevier/Gold Standard (2017-12-22 19:15:19) Glipizide tablets What is this medicine? GLIPIZIDE (GLIP i zide) helps to treat type 2 diabetes. Treatment is combined with diet and exercise. The medicine helps your body to use insulin better. This medicine may be used for other purposes; ask your health care provider or pharmacist if you have questions. COMMON BRAND NAME(S): Glucotrol What should I tell my health care provider before I take this medicine? They need to know if you have any of these conditions:  diabetic ketoacidosis  glucose-6-phosphate dehydrogenase deficiency  heart disease  kidney disease  liver disease  porphyria  severe infection or injury  thyroid disease  an unusual or allergic reaction to glipizide, sulfa drugs, other medicines, foods, dyes, or preservatives  pregnant or trying to get pregnant  breast-feeding How should I use this medicine? Take this  medicine by mouth. Swallow with a drink of water. Do not take with food. Take it 30 minutes before a meal. Follow the directions on the prescription label. If you take this medicine once a day, take it 30 minutes before breakfast. Take your doses at the same time each day. Do not take more often than directed. Talk to your pediatrician regarding the use of this medicine in children. Special care may be needed. Elderly patients over 63 years old may have a stronger reaction and need a smaller dose. Overdosage: If you think you have taken too much of this medicine contact a poison control center or emergency room at once. NOTE: This medicine is only for you. Do not share this medicine with others. What if I miss a dose? If you miss a dose, take it as soon as you can. If it is almost time for your next dose, take only that dose. Do not take double or extra doses. What may interact with this medicine?  bosentan  chloramphenicol  cisapride  clarithromycin  medicines for fungal or yeast infections  metoclopramide  probenecid  warfarin Many medications may cause an increase or decrease in blood sugar, these include:  alcohol containing beverages  aspirin and aspirin-like drugs  chloramphenicol  chromium  diuretics  male hormones, like estrogens or progestins and birth control pills  heart medicines  isoniazid  male hormones or anabolic steroids  medicines for weight loss  medicines for allergies, asthma, cold, or cough  medicines for mental problems  medicines called MAO Inhibitors like Nardil, Parnate, Marplan,  Eldepryl  niacin  NSAIDs, medicines for pain and inflammation, like ibuprofen or naproxen  pentamidine  phenytoin  probenecid  quinolone antibiotics like ciprofloxacin, levofloxacin, ofloxacin  some herbal dietary supplements  steroid medicines like prednisone or cortisone  thyroid medicine This list may not describe all possible interactions.  Give your health care provider a list of all the medicines, herbs, non-prescription drugs, or dietary supplements you use. Also tell them if you smoke, drink alcohol, or use illegal drugs. Some items may interact with your medicine. What should I watch for while using this medicine? Visit your doctor or health care professional for regular checks on your progress. A test called the HbA1C (A1C) will be monitored. This is a simple blood test. It measures your blood sugar control over the last 2 to 3 months. You will receive this test every 3 to 6 months. Learn how to check your blood sugar. Learn the symptoms of low and high blood sugar and how to manage them. Always carry a quick-source of sugar with you in case you have symptoms of low blood sugar. Examples include hard sugar candy or glucose tablets. Make sure others know that you can choke if you eat or drink when you develop serious symptoms of low blood sugar, such as seizures or unconsciousness. They must get medical help at once. Tell your doctor or health care professional if you have high blood sugar. You might need to change the dose of your medicine. If you are sick or exercising more than usual, you might need to change the dose of your medicine. Do not skip meals. Ask your doctor or health care professional if you should avoid alcohol. Many nonprescription cough and cold products contain sugar or alcohol. These can affect blood sugar. This medicine can make you more sensitive to the sun. Keep out of the sun. If you cannot avoid being in the sun, wear protective clothing and use sunscreen. Do not use sun lamps or tanning beds/booths. Wear a medical ID bracelet or chain, and carry a card that describes your disease and details of your medicine and dosage times. What side effects may I notice from receiving this medicine? Side effects that you should report to your doctor or health care professional as soon as possible:  allergic reactions like  skin rash, itching or hives, swelling of the face, lips, or tongue  breathing problems  dark urine  fever, chills, sore throat  signs and symptoms of low blood sugar such as feeling anxious, confusion, dizziness, increased hunger, unusually weak or tired, sweating, shakiness, cold, irritable, headache, blurred vision, fast heartbeat, loss of consciousness  unusual bleeding or bruising  yellowing of the eyes or skin Side effects that usually do not require medical attention (report to your doctor or health care professional if they continue or are bothersome):  diarrhea  dizziness  headache  heartburn  nausea  stomach gas This list may not describe all possible side effects. Call your doctor for medical advice about side effects. You may report side effects to FDA at 1-800-FDA-1088. Where should I keep my medicine? Keep out of the reach of children. Store at room temperature below 30 degrees C (86 degrees F). Throw away any unused medicine after the expiration date. NOTE: This sheet is a summary. It may not cover all possible information. If you have questions about this medicine, talk to your doctor, pharmacist, or health care provider.  2020 Elsevier/Gold Standard (2013-02-28 14:42:46)

## 2019-08-20 NOTE — Progress Notes (Signed)
[facility]   Established Patient Office Visit  Subjective:  Patient ID: Scott Allison, male    DOB: June 15, 1976  Age: 43 y.o. MRN: [id number]  CC:  Chief Complaint  Patient presents with  . New Patient (Initial Visit)    new patient , esrablishing care, discuss dm    HPI Scott Allison is a 43 year old male who presents for Hospital Follow Up and to Establish Care today.   Past Medical History:  Diagnosis Date  . Diabetes mellitus without complication ([place])   . Hyperglycemia 07/2019   Current Status: This will be his initial office visit with me. He was previously not seeing a regular physician for his PCP needs. Since his last office visit, he has had and ED visit on 07/29/2019 for Hyperglycemia. He is doing well with c/o blurry vision. He has not been monitoring his blood glucose levels. He denies fatigue, frequent urination, excessive hunger, excessive thirst, weight gain, weight loss, and poor wound healing. He continues to check his feet regularly. His anxiety is moderate today. He denies suicidal ideations, homicidal ideations, or auditory hallucinations. He denies fevers, chills, recent infections, weight loss, and night sweats. He has not had any headaches, dizziness, and falls. No chest pain, heart palpitations, cough and shortness of breath reported. No reports of GI problems such as diarrhea, and constipation. He has no reports of blood in stools, dysuria and hematuria. He denies pain today.   Past Surgical History:  Procedure Laterality Date  . EXTERNAL EAR SURGERY    . HAND SURGERY      Family History  Problem Relation Age of Onset  . Heart disease Mother   . Diabetes Father     Social History   Socioeconomic History  . Marital status: Single    Spouse name: Not on file  . Number of children: Not on file  . Years of education: Not on file  . Highest education level: Not on file  Occupational History  . Not on  file  Social Needs  . Financial resource strain: Not on file  . Food insecurity    Worry: Not on file    Inability: Not on file  . Transportation needs    Medical: Not on file    Non-medical: Not on file  Tobacco Use  . Smoking status: Current Every Day Smoker    Packs/day: 1.00    Years: 30.00    Pack years: 30.00    Types: Cigarettes  . Smokeless tobacco: Never Used  Substance and Sexual Activity  . Alcohol use: Not Currently  . Drug use: Yes    Types: Marijuana  . Sexual activity: Not on file  Lifestyle  . Physical activity    Days per week: Not on file    Minutes per session: Not on file  . Stress: Not on file  Relationships  . Social Herbalist on phone: Not on file    Gets together: Not on file    Attends religious service: Not on file    Active member of club or organization: Not on file    Attends meetings of clubs or organizations: Not on file    Relationship status: Not on file  . Intimate partner violence    Fear of current or ex partner: Not on file    Emotionally abused: Not on file    Physically abused: Not on file    Forced sexual activity: Not  on file  Other Topics Concern  . Not on file  Social History Narrative  . Not on file    Outpatient Medications Prior to Visit  Medication Sig Dispense Refill  . metFORMIN (GLUCOPHAGE) 1000 MG tablet Take 1 tablet (1,000 mg total) by mouth 2 (two) times daily. 30 tablet 0   No facility-administered medications prior to visit.     No Known Allergies  ROS Review of Systems  Constitutional: Negative.   HENT: Negative.   Eyes: Positive for visual disturbance (blurry vision).  Respiratory: Negative.   Cardiovascular: Negative.   Gastrointestinal: Negative.   Endocrine: Negative.   Genitourinary: Negative.   Musculoskeletal: Negative.   Skin: Negative.   Allergic/Immunologic: Negative.   Neurological: Positive for dizziness (occasional) and headaches (occasional ).  Hematological: Negative.    Psychiatric/Behavioral: Negative.    Objective:    Physical Exam  Constitutional: He is oriented to person, place, and time. He appears well-developed and well-nourished.  HENT:  Head: Normocephalic and atraumatic.  Eyes: Conjunctivae are normal.  Neck: Normal range of motion. Neck supple.  Pulmonary/Chest: Effort normal and breath sounds normal.  Abdominal: Soft. Bowel sounds are normal.  Musculoskeletal: Normal range of motion.  Neurological: He is alert and oriented to person, place, and time.  Skin: Skin is warm and dry.  Psychiatric: He has a normal mood and affect. His behavior is normal. Judgment and thought content normal.    BP (!) 164/84 (BP Location: Left Arm, Patient Position: Sitting, Cuff Size: Large)   Pulse 71   Temp 98.4 F (36.9 C) (Oral)   Ht _0  (1.88 m)   Wt 273 lb (123.8 kg)   BMI 35.05 kg/m  Wt Readings from Last 3 Encounters:  08/20/19 273 lb (123.8 kg)  07/29/19 250 lb (113.4 kg)     Health Maintenance Due  Topic Date Due  . URINE MICROALBUMIN  03/04/1986  . HIV Screening  03/05/1991  . TETANUS/TDAP  03/05/1995  . INFLUENZA VACCINE  06/30/2019    There are no preventive care reminders to display for this patient.  Lab Results  Component Value Date   TSH 1.250 08/20/2019   Lab Results  Component Value Date   WBC 8.9 08/20/2019   HGB 14.5 08/20/2019   HCT 42.4 08/20/2019   MCV 87 08/20/2019   PLT 283 08/20/2019   Lab Results  Component Value Date   NA 139 08/20/2019   K 4.3 08/20/2019   CO2 24 08/20/2019   GLUCOSE 105 (H) 08/20/2019   BUN 13 08/20/2019   CREATININE 0.87 08/20/2019   BILITOT 0.9 08/20/2019   ALKPHOS 53 08/20/2019   AST 23 08/20/2019   ALT 44 08/20/2019   PROT 7.1 08/20/2019   ALBUMIN 4.6 08/20/2019   CALCIUM 9.5 08/20/2019   ANIONGAP 10 07/30/2019   Lab Results  Component Value Date   CHOL 160 08/20/2019   Lab Results  Component Value Date   HDL 46 08/20/2019   No results found for: [facility] Lab  Results  Component Value Date   TRIG 86 08/20/2019   Lab Results  Component Value Date   CHOLHDL 3.5 08/20/2019   Lab Results  Component Value Date   HGBA1C 10.1 (A) 08/20/2019   HGBA1C 10.1 08/20/2019   HGBA1C 10.1 (A) 08/20/2019   HGBA1C 10.1 (A) 08/20/2019      Assessment & Plan:   1. Hospital discharge follow-up  2. Encounter to establish care  3. Hyperglycemia - blood glucose meter kit and supplies;  Dispense based on patient and insurance preference. Use up to four times daily as directed. (FOR ICD-10 E10.9, E11.9).  Dispense: 1 each; Refill: 0 - HgB A1c - glipiZIDE (GLUCOTROL) 10 MG tablet; Take 1 tablet (10 mg total) by mouth 2 (two) times daily before a meal.  Dispense: 60 tablet; Refill: 3 - metFORMIN (GLUCOPHAGE) 1000 MG tablet; Take 1 tablet (1,000 mg total) by mouth 2 (two) times daily.  Dispense: 60 tablet; Refill: 3  4. 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 He will continue to decrease foods/beverages high in sugars and carbs and follow Heart Healthy or DASH diet. Increase physical activity to at least 30 minutes cardio exercise daily.  - blood glucose meter kit and supplies; Dispense based on patient and insurance preference. Use up to four times daily as directed. (FOR ICD-10 E10.9, E11.9).  Dispense: 1 each; Refill: 0 - HgB A1c - glipiZIDE (GLUCOTROL) 10 MG tablet; Take 1 tablet (10 mg total) by mouth 2 (two) times daily before a meal.  Dispense: 60 tablet; Refill: 3 - metFORMIN (GLUCOPHAGE) 1000 MG tablet; Take 1 tablet (1,000 mg total) by mouth 2 (two) times daily.  Dispense: 60 tablet; Refill: 3  5. Hemoglobin A1c greater than 9% indicating poor diabetic control A1c is 10.1 today.   6. Blurry vision, bilateral Probable r/t increase blood glucose levels.   7. Abnormal urinalysis Results are pending.  - Urine Culture  8. Healthcare maintenance - POCT  Urinalysis Dipstick - POCT Glucose (Device for Home Use) - CBC with Differential - Lipid Panel - TSH - PSA - Vitamin B12 - Vitamin D, 25-hydroxy - Comprehensive metabolic panel  8. Follow up He will follow up in 1 month.   Meds ordered this encounter  Medications  . blood glucose meter kit and supplies    Sig: Dispense based on patient and insurance preference. Use up to four times daily as directed. (FOR ICD-10 E10.9, E11.9).    Dispense:  1 each    Refill:  0    Order Specific Question:   Number of strips    Answer:   100    Order Specific Question:   Number of lancets    Answer:   100  . glipiZIDE (GLUCOTROL) 10 MG tablet    Sig: Take 1 tablet (10 mg total) by mouth 2 (two) times daily before a meal.    Dispense:  60 tablet    Refill:  3  . metFORMIN (GLUCOPHAGE) 1000 MG tablet    Sig: Take 1 tablet (1,000 mg total) by mouth 2 (two) times daily.    Dispense:  60 tablet    Refill:  3    Orders Placed This Encounter  Procedures  . Urine Culture  . CBC with Differential  . Lipid Panel  . TSH  . PSA  . Vitamin B12  . Vitamin D, 25-hydroxy  . Comprehensive metabolic panel  . POCT Urinalysis Dipstick  . POCT Glucose (Device for Home Use)  . HgB A1c    Referral Orders  No referral(s) requested today    Scott Becton,  MSN, FNP-BC [place] [address] [place], [place] [address] [phone] fax   Problem List Items Addressed This Visit    None    Visit Diagnoses    Hospital discharge follow-up    -  Primary  Encounter to establish care       Hyperglycemia       Relevant Medications   blood glucose meter kit and supplies   glipiZIDE (GLUCOTROL) 10 MG tablet   metFORMIN (GLUCOPHAGE) 1000 MG tablet   Other Relevant Orders   HgB A1c (Completed)   Type 2 diabetes mellitus with other specified complication, with long-term current use of insulin (HCC)       Relevant  Medications   blood glucose meter kit and supplies   glipiZIDE (GLUCOTROL) 10 MG tablet   metFORMIN (GLUCOPHAGE) 1000 MG tablet   Other Relevant Orders   HgB A1c (Completed)   Blurry vision, bilateral       Abnormal urinalysis       Relevant Orders   Urine Culture   Healthcare maintenance       Relevant Orders   POCT Urinalysis Dipstick   POCT Glucose (Device for Home Use) (Completed)   CBC with Differential (Completed)   Lipid Panel (Completed)   TSH (Completed)   PSA (Completed)   Vitamin B12 (Completed)   Vitamin D, 25-hydroxy (Completed)   Comprehensive metabolic panel (Completed)   Follow up          Meds ordered this encounter  Medications  . blood glucose meter kit and supplies    Sig: Dispense based on patient and insurance preference. Use up to four times daily as directed. (FOR ICD-10 E10.9, E11.9).    Dispense:  1 each    Refill:  0    Order Specific Question:   Number of strips    Answer:   100    Order Specific Question:   Number of lancets    Answer:   100  . glipiZIDE (GLUCOTROL) 10 MG tablet    Sig: Take 1 tablet (10 mg total) by mouth 2 (two) times daily before a meal.    Dispense:  60 tablet    Refill:  3  . metFORMIN (GLUCOPHAGE) 1000 MG tablet    Sig: Take 1 tablet (1,000 mg total) by mouth 2 (two) times daily.    Dispense:  60 tablet    Refill:  3    Follow-up: Return in about 1 month (around 09/19/2019).    Azzie Glatter, FNP

## 2019-08-21 DIAGNOSIS — H538 Other visual disturbances: Secondary | ICD-10-CM | POA: Insufficient documentation

## 2019-08-21 DIAGNOSIS — R7309 Other abnormal glucose: Secondary | ICD-10-CM | POA: Insufficient documentation

## 2019-08-21 DIAGNOSIS — E119 Type 2 diabetes mellitus without complications: Secondary | ICD-10-CM | POA: Insufficient documentation

## 2019-08-21 DIAGNOSIS — R739 Hyperglycemia, unspecified: Secondary | ICD-10-CM | POA: Insufficient documentation

## 2019-08-21 LAB — VITAMIN D 25 HYDROXY (VIT D DEFICIENCY, FRACTURES): Vit D, 25-Hydroxy: 11.5 ng/mL — ABNORMAL LOW (ref 30.0–100.0)

## 2019-08-21 LAB — VITAMIN B12: Vitamin B-12: 627 pg/mL (ref 232–1245)

## 2019-08-21 LAB — COMPREHENSIVE METABOLIC PANEL
ALT: 44 IU/L (ref 0–44)
AST: 23 IU/L (ref 0–40)
Albumin/Globulin Ratio: 1.8 (ref 1.2–2.2)
Albumin: 4.6 g/dL (ref 4.0–5.0)
Alkaline Phosphatase: 53 IU/L (ref 39–117)
BUN/Creatinine Ratio: 15 (ref 9–20)
BUN: 13 mg/dL (ref 6–24)
Bilirubin Total: 0.9 mg/dL (ref 0.0–1.2)
CO2: 24 mmol/L (ref 20–29)
Calcium: 9.5 mg/dL (ref 8.7–10.2)
Chloride: 101 mmol/L (ref 96–106)
Creatinine, Ser: 0.87 mg/dL (ref 0.76–1.27)
GFR calc Af Amer: 122 mL/min/{1.73_m2} (ref >59)
GFR calc non Af Amer: 106 mL/min/{1.73_m2} (ref >59)
Globulin, Total: 2.5 g/dL (ref 1.5–4.5)
Glucose: 105 mg/dL — ABNORMAL HIGH (ref 65–99)
Potassium: 4.3 mmol/L (ref 3.5–5.2)
Sodium: 139 mmol/L (ref 134–144)
Total Protein: 7.1 g/dL (ref 6.0–8.5)

## 2019-08-21 LAB — CBC WITH DIFFERENTIAL/PLATELET
Basophils Absolute: 0.1 10*3/uL (ref 0.0–0.2)
Basos: 1 %
EOS (ABSOLUTE): 0.1 10*3/uL (ref 0.0–0.4)
Eos: 1 %
Hematocrit: 42.4 % (ref 37.5–51.0)
Hemoglobin: 14.5 g/dL (ref 13.0–17.7)
Immature Grans (Abs): 0 10*3/uL (ref 0.0–0.1)
Immature Granulocytes: 0 %
Lymphocytes Absolute: 2.7 10*3/uL (ref 0.7–3.1)
Lymphs: 30 %
MCH: 29.7 pg (ref 26.6–33.0)
MCHC: 34.2 g/dL (ref 31.5–35.7)
MCV: 87 fL (ref 79–97)
Monocytes Absolute: 0.8 10*3/uL (ref 0.1–0.9)
Monocytes: 9 %
Neutrophils Absolute: 5.2 10*3/uL (ref 1.4–7.0)
Neutrophils: 59 %
Platelets: 283 10*3/uL (ref 150–450)
RBC: 4.89 x10E6/uL (ref 4.14–5.80)
RDW: 13.1 % (ref 11.6–15.4)
WBC: 8.9 10*3/uL (ref 3.4–10.8)

## 2019-08-21 LAB — LIPID PANEL
Chol/HDL Ratio: 3.5 ratio (ref 0.0–5.0)
Cholesterol, Total: 160 mg/dL (ref 100–199)
HDL: 46 mg/dL (ref >39)
LDL Chol Calc (NIH): 98 mg/dL (ref 0–99)
Triglycerides: 86 mg/dL (ref 0–149)
VLDL Cholesterol Cal: 16 mg/dL (ref 5–40)

## 2019-08-21 LAB — TSH: TSH: 1.25 u[IU]/mL (ref 0.450–4.500)

## 2019-08-21 LAB — PSA: Prostate Specific Ag, Serum: 0.6 ng/mL (ref 0.0–4.0)

## 2019-08-22 LAB — URINE CULTURE

## 2019-08-23 ENCOUNTER — Other Ambulatory Visit: Payer: Self-pay | Admitting: Family Medicine

## 2019-08-23 ENCOUNTER — Encounter: Payer: Self-pay | Admitting: Family Medicine

## 2019-08-23 DIAGNOSIS — E559 Vitamin D deficiency, unspecified: Secondary | ICD-10-CM

## 2019-08-23 MED ORDER — VITAMIN D (ERGOCALCIFEROL) 1.25 MG (50000 UNIT) PO CAPS: 50000.0000 [IU] | ORAL_CAPSULE | ORAL | 3 refills | Status: DC

## 2019-08-24 ENCOUNTER — Telehealth: Payer: Self-pay

## 2019-08-24 NOTE — Telephone Encounter (Signed)
-----   Message from Azzie Glatter, [place] sent at 08/23/2019  8:31 PM EDT ----- Vitamin D level is extremely low. Rx for Vitamin D sent to pharmacy today. He should include foods that are high in Vitamin D. These include: Salmon, Cod Liver Oil, Mushrooms, Canned Fish, Milk, and Egg Yolks.  All other labs are stable. Keep follow up appointment as scheduled. Please inform patient. Thank you.

## 2019-08-24 NOTE — Telephone Encounter (Signed)
Called and spoke with patient, advised that vitamin D was low and that RX for Vitamin D was sent to the pharmacy for him to take 1 capsule once a week as directed. Asked that he include more vitamin D rich roods and gave examples. Advised that all labs are stable and to keep follow up appointment. Thanks!

## 2019-09-19 ENCOUNTER — Encounter: Payer: Self-pay | Admitting: Family Medicine

## 2019-09-19 ENCOUNTER — Ambulatory Visit (INDEPENDENT_AMBULATORY_CARE_PROVIDER_SITE_OTHER): Payer: Self-pay | Admitting: Family Medicine

## 2019-09-19 ENCOUNTER — Other Ambulatory Visit: Payer: Self-pay

## 2019-09-19 VITALS — BP 147/79 | HR 66 | Temp 98.3°F | Ht 74.0 in | Wt 269.0 lb

## 2019-09-19 DIAGNOSIS — E1169 Type 2 diabetes mellitus with other specified complication: Secondary | ICD-10-CM

## 2019-09-19 DIAGNOSIS — Z0000 Encounter for general adult medical examination without abnormal findings: Secondary | ICD-10-CM

## 2019-09-19 DIAGNOSIS — Z794 Long term (current) use of insulin: Secondary | ICD-10-CM

## 2019-09-19 DIAGNOSIS — N390 Urinary tract infection, site not specified: Secondary | ICD-10-CM

## 2019-09-19 DIAGNOSIS — Z09 Encounter for follow-up examination after completed treatment for conditions other than malignant neoplasm: Secondary | ICD-10-CM

## 2019-09-19 DIAGNOSIS — R8290 Unspecified abnormal findings in urine: Secondary | ICD-10-CM

## 2019-09-19 LAB — GLUCOSE, POCT (MANUAL RESULT ENTRY): POC Glucose: 54 mg/dl — AB (ref 70–99)

## 2019-09-19 LAB — POCT URINALYSIS DIPSTICK
Bilirubin, UA: NEGATIVE
Glucose, UA: NEGATIVE
Ketones, UA: NEGATIVE
Nitrite, UA: NEGATIVE
Protein, UA: NEGATIVE
Spec Grav, UA: >=1.03 — AB (ref 1.010–1.025)
Urobilinogen, UA: 0.2 E.U./dL
pH, UA: 5.5 (ref 5.0–8.0)

## 2019-09-19 LAB — POCT GLYCOSYLATED HEMOGLOBIN (HGB A1C)
HbA1c POC (<> result, manual entry): 7.6 % (ref 4.0–5.6)
Hemoglobin A1C: 7 % — AB (ref 4.0–5.6)

## 2019-09-19 MED ORDER — SULFAMETHOXAZOLE-TRIMETHOPRIM 800-160 MG PO TABS: 1.0000 | ORAL_TABLET | Freq: Two times a day (BID) | ORAL | 0 refills | Status: AC

## 2019-09-19 NOTE — Progress Notes (Signed)
Patient [facility]   Annual Physical   Subjective:  Patient ID: Scott Allison, male    DOB: Jun 29, 1976  Age: 43 y.o. MRN: [id number]  CC: No chief complaint on file.   HPI Scott Allison is a 43 year old male who presents for his Annual Physical today.   Past Medical History:  Diagnosis Date  . Diabetes mellitus without complication ([place])   . Hyperglycemia 07/2019  . Vitamin D deficiency 07/2019   Current Status: Since his last office visit, he is doing well with no complaints. His most recent normal range of preprandial blood glucose levels have been between 120-130. He has seen low range of 70 and high of 130 since his last office visit. He denies fatigue, frequent urination, blurred vision, excessive hunger, excessive thirst, weight gain, weight loss, and poor wound healing. He continues to check his feet regularly. He denies fevers, chills, recent infections, weight loss, and night sweats. He has not had any and falls. No chest pain, heart palpitations, cough and shortness of breath reported. No reports of GI problems such as nausea, vomiting, diarrhea, and constipation. He has no reports of blood in stools, dysuria and hematuria. No depression or anxiety reported today. He denies pain today.   Past Surgical History:  Procedure Laterality Date  . EXTERNAL EAR SURGERY    . HAND SURGERY      Family History  Problem Relation Age of Onset  . Heart disease Mother   . Diabetes Father     Social History   Socioeconomic History  . Marital status: Single    Spouse name: Not on file  . Number of children: Not on file  . Years of education: Not on file  . Highest education level: Not on file  Occupational History  . Not on file  Social Needs  . Financial resource strain: Not on file  . Food insecurity    Worry: Not on file    Inability: Not on file  . Transportation needs    Medical: Not on file    Non-medical: Not on file  Tobacco  Use  . Smoking status: Current Every Day Smoker    Packs/day: 1.00    Years: 30.00    Pack years: 30.00    Types: Cigarettes  . Smokeless tobacco: Never Used  Substance and Sexual Activity  . Alcohol use: Not Currently  . Drug use: Yes    Types: Marijuana  . Sexual activity: Not on file  Lifestyle  . Physical activity    Days per week: Not on file    Minutes per session: Not on file  . Stress: Not on file  Relationships  . Social Herbalist on phone: Not on file    Gets together: Not on file    Attends religious service: Not on file    Active member of club or organization: Not on file    Attends meetings of clubs or organizations: Not on file    Relationship status: Not on file  . Intimate partner violence    Fear of current or ex partner: Not on file    Emotionally abused: Not on file    Physically abused: Not on file    Forced sexual activity: Not on file  Other Topics Concern  . Not on file  Social History Narrative  . Not on file    Outpatient Medications Prior to Visit  Medication Sig Dispense Refill  . blood  glucose meter kit and supplies Dispense based on patient and insurance preference. Use up to four times daily as directed. (FOR ICD-10 E10.9, E11.9). 1 each 0  . glipiZIDE (GLUCOTROL) 10 MG tablet Take 1 tablet (10 mg total) by mouth 2 (two) times daily before a meal. 60 tablet 3  . metFORMIN (GLUCOPHAGE) 1000 MG tablet Take 1 tablet (1,000 mg total) by mouth 2 (two) times daily. 60 tablet 3  . Vitamin D, Ergocalciferol, (DRISDOL) 1.25 MG (50000 UT) CAPS capsule Take 1 capsule (50,000 Units total) by mouth every 7 (seven) days. 5 capsule 3   No facility-administered medications prior to visit.     No Known Allergies  ROS Review of Systems  Constitutional: Positive for fatigue.  HENT: Negative.   Eyes: Negative.   Respiratory: Negative.   Cardiovascular: Negative.   Gastrointestinal: Negative.   Endocrine: Negative.   Genitourinary:  Negative.   Musculoskeletal: Negative.   Skin: Negative.   Allergic/Immunologic: Negative.   Neurological: Positive for dizziness (occasional ) and headaches (occasional).  Hematological: Negative.   Psychiatric/Behavioral: Negative.       Objective:    Physical Exam  Constitutional: He is oriented to person, place, and time. He appears well-developed and well-nourished.  HENT:  Head: Normocephalic and atraumatic.  Eyes: Conjunctivae are normal.  Neck: Normal range of motion. Neck supple.  Cardiovascular: Normal rate, regular rhythm, normal heart sounds and intact distal pulses.  Pulmonary/Chest: Effort normal and breath sounds normal.  Abdominal: Soft. Bowel sounds are normal.  Musculoskeletal: Normal range of motion.  Neurological: He is alert and oriented to person, place, and time. He has normal reflexes.  Skin: Skin is warm and dry.  Psychiatric: He has a normal mood and affect. His behavior is normal. Judgment and thought content normal.  Nursing note and vitals reviewed.   BP (!) 147/79 Comment: not on bp meds  Pulse 66   Temp 98.3 F (36.8 C) (Oral)   Ht _0  (1.88 m)   Wt 269 lb (122 kg)   SpO2 97%   BMI 34.54 kg/m  Wt Readings from Last 3 Encounters:  09/19/19 269 lb (122 kg)  08/20/19 273 lb (123.8 kg)  07/29/19 250 lb (113.4 kg)     Health Maintenance Due  Topic Date Due  . PNEUMOCOCCAL POLYSACCHARIDE VACCINE AGE 29-64 HIGH RISK  03/04/1978  . FOOT EXAM  03/04/1986  . OPHTHALMOLOGY EXAM  03/04/1986  . URINE MICROALBUMIN  03/04/1986  . HIV Screening  03/05/1991  . TETANUS/TDAP  03/05/1995  . INFLUENZA VACCINE  06/30/2019    There are no preventive care reminders to display for this patient.  Lab Results  Component Value Date   TSH 1.250 08/20/2019   Lab Results  Component Value Date   WBC 8.9 08/20/2019   HGB 14.5 08/20/2019   HCT 42.4 08/20/2019   MCV 87 08/20/2019   PLT 283 08/20/2019   Lab Results  Component Value Date   NA 139  08/20/2019   K 4.3 08/20/2019   CO2 24 08/20/2019   GLUCOSE 105 (H) 08/20/2019   BUN 13 08/20/2019   CREATININE 0.87 08/20/2019   BILITOT 0.9 08/20/2019   ALKPHOS 53 08/20/2019   AST 23 08/20/2019   ALT 44 08/20/2019   PROT 7.1 08/20/2019   ALBUMIN 4.6 08/20/2019   CALCIUM 9.5 08/20/2019   ANIONGAP 10 07/30/2019   Lab Results  Component Value Date   CHOL 160 08/20/2019   Lab Results  Component Value Date  HDL 46 08/20/2019   Lab Results  Component Value Date   LDLCALC 98 08/20/2019   Lab Results  Component Value Date   TRIG 86 08/20/2019   Lab Results  Component Value Date   CHOLHDL 3.5 08/20/2019   Lab Results  Component Value Date   HGBA1C 7.0 (A) 09/19/2019   HGBA1C 7.6 09/19/2019      Assessment & Plan:   1. Physical exam, annual Physical exam performed today. No abnormalities noted. He will continue self-testicle exams, he will continue to take medications as prescribed, to decrease high sodium intake, excessive alcohol intake, increase potassium intake, smoking cessation, and increase physical activity of at least 30 minutes of cardio activity daily he will continue to follow Heart Healthy or DASH diet. - POCT urinalysis dipstick - POCT glycosylated hemoglobin (Hb A1C) - POCT glucose (manual entry)  2. Urinary tract infection without hematuria, site unspecified We will initiate antibiotic today.  - sulfamethoxazole-trimethoprim (BACTRIM DS) 800-160 MG tablet; Take 1 tablet by mouth 2 (two) times daily.  Dispense: 14 tablet; Refill: 0  3. Type 2 diabetes mellitus with other specified complication, with long-term current use of insulin (HCC) He will continue medication as prescribed, to decrease foods/beverages high in sugars and carbs and follow Heart Healthy or DASH diet. Increase physical activity to at least 30 minutes cardio exercise daily.  - Referral to Nutrition and Diabetes Services  4. Abnormal urinalysis Results are pending.  - Urine Culture   5. Follow up He will follow up in 1 month.   Meds ordered this encounter  Medications  . sulfamethoxazole-trimethoprim (BACTRIM DS) 800-160 MG tablet    Sig: Take 1 tablet by mouth 2 (two) times daily.    Dispense:  14 tablet    Refill:  0    Orders Placed This Encounter  Procedures  . Urine Culture  . Referral to Nutrition and Diabetes Services  . POCT urinalysis dipstick  . POCT glycosylated hemoglobin (Hb A1C)  . POCT glucose (manual entry)     Referral Orders     Referral to Nutrition and Diabetes Services   Kathe Becton,  MSN, FNP-BC [place], [place] [address] [phone] fax    Problem List Items Addressed This Visit      Endocrine   Diabetes mellitus [facility])   Relevant Orders   Referral to Nutrition and Diabetes Services    Other Visit Diagnoses    Physical exam, annual    -  Primary   Relevant Orders   POCT urinalysis dipstick (Completed)   POCT glycosylated hemoglobin (Hb A1C) (Completed)   POCT glucose (manual entry) (Completed)   Urinary tract infection without hematuria, site unspecified       Relevant Medications   sulfamethoxazole-trimethoprim (BACTRIM DS) 800-160 MG tablet   Abnormal urinalysis       Relevant Orders   Urine Culture (Completed)   Follow up          Meds ordered this encounter  Medications  . sulfamethoxazole-trimethoprim (BACTRIM DS) 800-160 MG tablet    Sig: Take 1 tablet by mouth 2 (two) times daily.    Dispense:  14 tablet    Refill:  0    Follow-up: Return in about 1 month (around 10/20/2019).    Azzie Glatter, FNP

## 2019-09-21 LAB — URINE CULTURE

## 2019-10-08 ENCOUNTER — Other Ambulatory Visit: Payer: Self-pay | Admitting: Family Medicine

## 2019-10-11 ENCOUNTER — Other Ambulatory Visit: Payer: Self-pay | Admitting: Family Medicine

## 2019-10-11 DIAGNOSIS — Z794 Long term (current) use of insulin: Secondary | ICD-10-CM

## 2019-10-11 DIAGNOSIS — E1169 Type 2 diabetes mellitus with other specified complication: Secondary | ICD-10-CM

## 2019-10-11 DIAGNOSIS — R739 Hyperglycemia, unspecified: Secondary | ICD-10-CM

## 2019-10-11 DIAGNOSIS — E559 Vitamin D deficiency, unspecified: Secondary | ICD-10-CM

## 2019-10-23 ENCOUNTER — Ambulatory Visit: Payer: Self-pay | Admitting: Family Medicine

## 2019-11-01 ENCOUNTER — Ambulatory Visit: Payer: Self-pay | Admitting: *Deleted

## 2019-11-21 ENCOUNTER — Ambulatory Visit: Payer: Self-pay | Admitting: Family Medicine

## 2020-01-21 ENCOUNTER — Other Ambulatory Visit: Payer: Self-pay | Admitting: Family Medicine

## 2020-01-21 DIAGNOSIS — E559 Vitamin D deficiency, unspecified: Secondary | ICD-10-CM

## 2020-01-27 ENCOUNTER — Other Ambulatory Visit: Payer: Self-pay | Admitting: Family Medicine

## 2020-01-27 DIAGNOSIS — R739 Hyperglycemia, unspecified: Secondary | ICD-10-CM

## 2020-01-27 DIAGNOSIS — Z794 Long term (current) use of insulin: Secondary | ICD-10-CM

## 2020-01-27 DIAGNOSIS — E1169 Type 2 diabetes mellitus with other specified complication: Secondary | ICD-10-CM

## 2020-11-12 IMAGING — DX PORTABLE CHEST - 1 VIEW
1 series · 1 of 1 positions shown · non-contrast
Comparison: None.

CLINICAL DATA: Near syncope.

EXAM:
PORTABLE CHEST 1 VIEW

[chest ap]
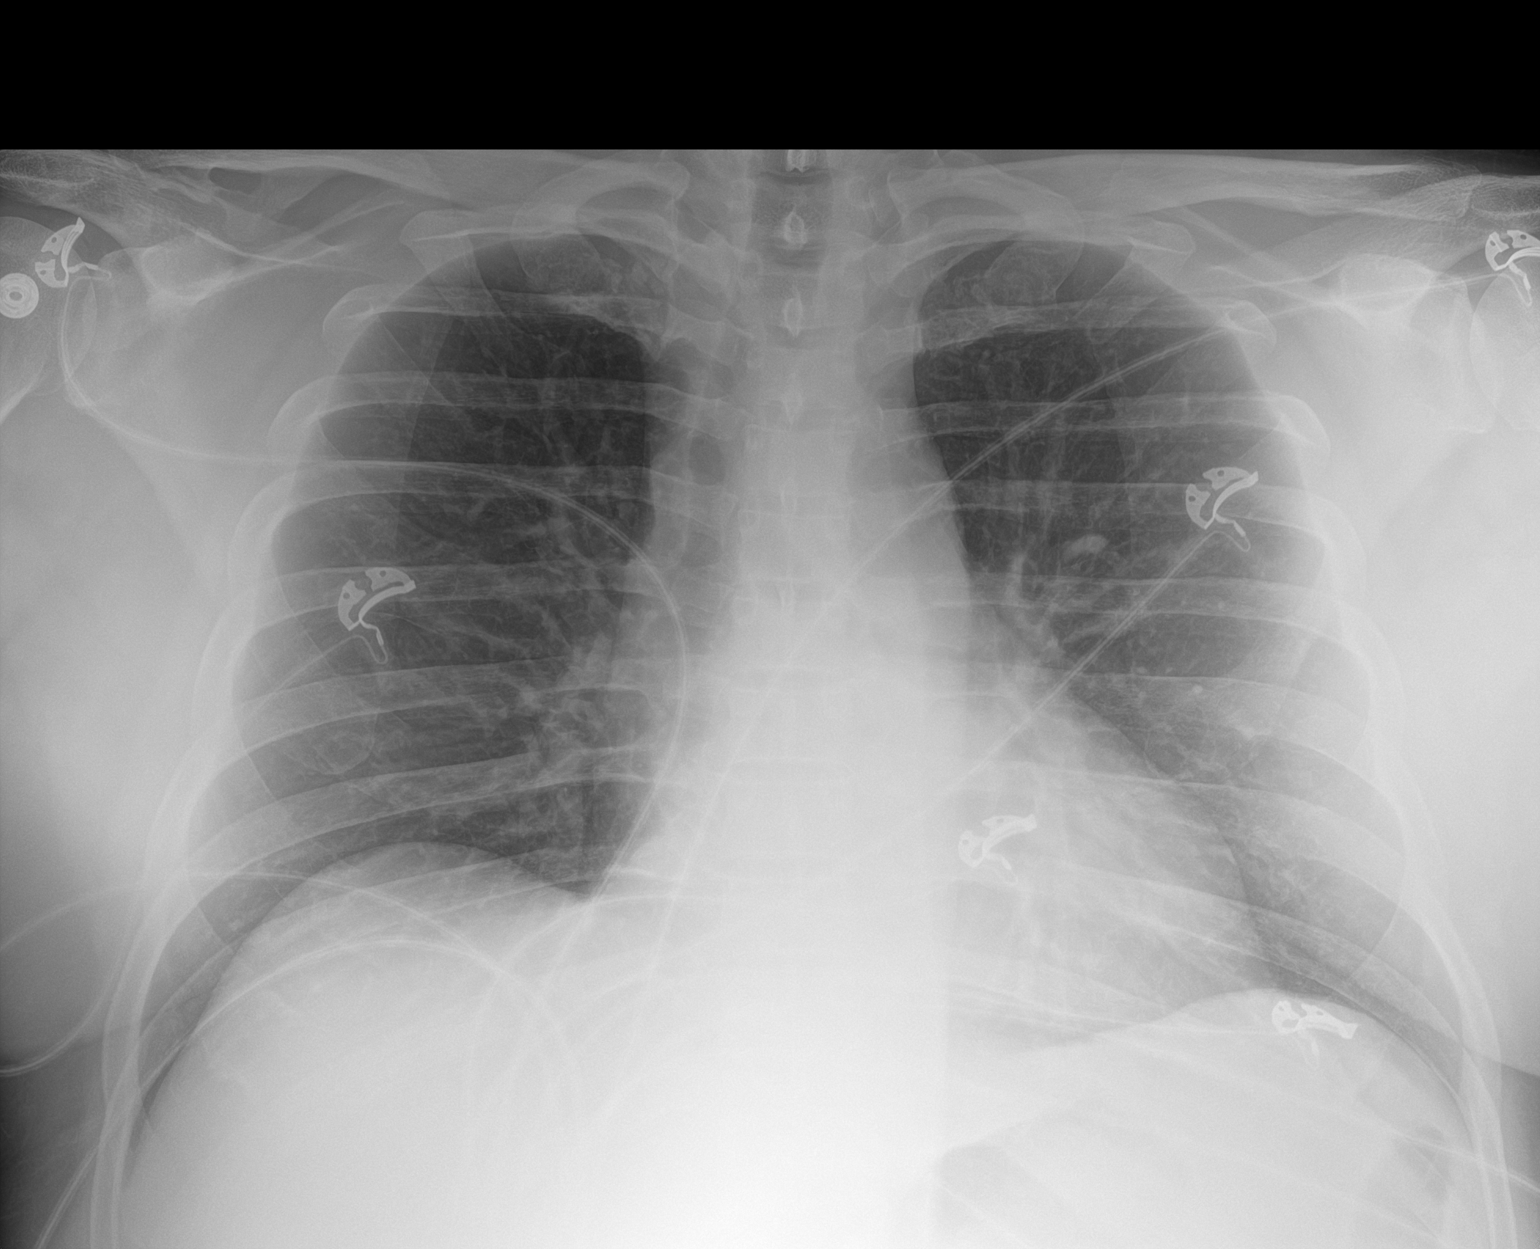

[1 of 1 positions shown; findings below may reference images not displayed]

FINDINGS: Right distal clavicular deformity likely from an old fracture.

The lungs appear clear.  Cardiac and mediastinal contours normal.

No pleural effusion identified.
IMPRESSION: No active cardiopulmonary disease is radiographically apparent.
